# Patient Record
Sex: Male | Born: 1995 | Race: Black or African American | Hispanic: No | Marital: Single | Smoking: Current every day smoker
Health system: Southern US, Community
[De-identification: ages and names within clinical notes are randomized; demographics above are authoritative.]

---
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
## 2014-02-14 ENCOUNTER — Encounter (HOSPITAL_BASED_OUTPATIENT_CLINIC_OR_DEPARTMENT_OTHER): Payer: Self-pay | Admitting: Emergency Medicine

## 2014-02-14 ENCOUNTER — Emergency Department (HOSPITAL_BASED_OUTPATIENT_CLINIC_OR_DEPARTMENT_OTHER)
Admission: EM | Admit: 2014-02-14 | Discharge: 2014-02-14 | Disposition: A | Discharge status: Home / Self Care | Payer: Worker's Compensation | Attending: Emergency Medicine | Admitting: Emergency Medicine

## 2014-02-14 DIAGNOSIS — W298XXA Contact with other powered powered hand tools and household machinery, initial encounter: Secondary | ICD-10-CM | POA: Insufficient documentation

## 2014-02-14 DIAGNOSIS — Y9389 Activity, other specified: Secondary | ICD-10-CM | POA: Insufficient documentation

## 2014-02-14 DIAGNOSIS — S81009A Unspecified open wound, unspecified knee, initial encounter: Secondary | ICD-10-CM | POA: Insufficient documentation

## 2014-02-14 DIAGNOSIS — S81812A Laceration without foreign body, left lower leg, initial encounter: Secondary | ICD-10-CM

## 2014-02-14 DIAGNOSIS — F17200 Nicotine dependence, unspecified, uncomplicated: Secondary | ICD-10-CM | POA: Insufficient documentation

## 2014-02-14 DIAGNOSIS — Y9289 Other specified places as the place of occurrence of the external cause: Secondary | ICD-10-CM | POA: Diagnosis not present

## 2014-02-14 DIAGNOSIS — S81809A Unspecified open wound, unspecified lower leg, initial encounter: Secondary | ICD-10-CM | POA: Diagnosis not present

## 2014-02-14 DIAGNOSIS — S91009A Unspecified open wound, unspecified ankle, initial encounter: Principal | ICD-10-CM

## 2014-02-14 NOTE — ED Provider Notes (Signed)
LACERATION REPAIR Performed by: Elpidio AnisUPSTILL, Mattie Nordell A Authorized by: Elpidio AnisUPSTILL, Merranda Bolls A Consent: Verbal consent obtained. Risks and benefits: risks, benefits and alternatives were discussed Consent given by: patient Patient identity confirmed: provided demographic data Prepped and Draped in normal sterile fashion Wound explored  Laceration Location: left lower leg  Laceration Length: 4cm  No Foreign Bodies seen or palpated  Anesthesia: local infiltration  Local anesthetic: lidocaine 2% w/ epinephrine  Anesthetic total: 3 ml  Irrigation method: syringe Amount of cleaning: standard  Skin closure: staples  Number of sutures: 4  Technique: staples  Patient tolerance: Patient tolerated the procedure well with no immediate complications.   Arnoldo HookerShari A Liat Mayol, PA-C 02/14/14 [phone]

## 2014-02-14 NOTE — ED Notes (Signed)
Pt had 5 staples. Bacitracin and gauze drsg applied to wound.

## 2014-02-14 NOTE — Discharge Instructions (Signed)
Have staples removed in 7 to 10 days. Return for signs of infection.  If you were given medicines take as directed.  If you are on coumadin or contraceptives realize their levels and effectiveness is altered by many different medicines.  If you have any reaction (rash, tongues swelling, other) to the medicines stop taking and see a physician.   Please follow up as directed and return to the ER or see a physician for new or worsening symptoms.  Thank you. Filed Vitals:   02/14/14 1432  BP: 123/73  Pulse: 61  Temp: 97.8 F (36.6 C)  TempSrc: Oral  Resp: 18  Height: 5\' 9"  (1.753 m)  Weight: 152 lb (68.947 kg)  SpO2: 100%

## 2014-02-14 NOTE — ED Provider Notes (Signed)
CSN: [id number]     Arrival date & time 02/14/14  1426 History   First MD Initiated Contact with Patient 02/14/14 1434     Chief Complaint  Patient presents with  . Extremity Laceration     (Consider location/radiation/quality/duration/timing/severity/associated sxs/prior Treatment) HPI Comments: 18 year old male with no significant medical history presents with left leg laceration prior to arrival while playing with box cutters last night. No significant bleeding, mild gaping, mild tenderness to palpation  The history is provided by the patient.    History reviewed. No pertinent past medical history. History reviewed. No pertinent past surgical history. No family history on file. History  Substance Use Topics  . Smoking status: Current Every Day Smoker -- 1.00 packs/day    Types: Cigarettes  . Smokeless tobacco: Not on file  . Alcohol Use: Not on file    Review of Systems  Constitutional: Negative for fever.  Skin: Positive for wound.  Neurological: Negative for numbness.      Allergies  Review of patient's allergies indicates no known allergies.  Home Medications   Prior to Admission medications   Not on File   BP 123/73  Pulse 61  Temp(Src) 97.8 F (36.6 C) (Oral)  Resp 18  Ht 5\' 9"  (1.753 m)  Wt 152 lb (68.947 kg)  BMI 22.44 kg/m2  SpO2 100% Physical Exam  Nursing note and vitals reviewed. Constitutional: He appears well-developed and well-nourished. No distress.  Cardiovascular: Normal rate.   Musculoskeletal: He exhibits tenderness. He exhibits no edema.  Neurological: He is alert.  Skin: Skin is warm.  4 cm linear laceration with mild gaping to inner aspect of left calf, no active bleeding    ED Course  Procedures (including critical care time) See separate lac repair note by PA/ NP.  Labs Review Labs Reviewed - No data to display  Imaging Review No results found.   EKG Interpretation None      MDM   Final diagnoses:  Leg  laceration, left, initial encounter   Low risk superficial skin laceration will be repaired in the emergency department. Followup outpatient discussed the patient.  Results and differential diagnosis were discussed with the patient/parent/guardian. Close follow up outpatient was discussed, comfortable with the plan.   Medications - No data to display  Filed Vitals:   02/14/14 1432  BP: 123/73  Pulse: 61  Temp: 97.8 F (36.6 C)  TempSrc: Oral  Resp: 18  Height: 5\' 9"  (1.753 m)  Weight: 152 lb (68.947 kg)  SpO2: 100%         Enid SkeensJoshua M Summerlyn Fickel, MD 02/14/14 1521

## 2014-02-14 NOTE — ED Notes (Signed)
Pt states he was cutting open boxes with a box cutter and cut his left leg through his blue jeans.  2 in laceration to inner side of upper calf. Bleeding controlled.

## 2014-02-15 NOTE — ED Provider Notes (Signed)
I agree with lac repair and reviewed results post repair. Enid SkeensZAVITZ, Daiana Vitiello M   Enid SkeensJoshua M Kinzey Sheriff, MD 02/15/14 [phone]

## 2014-02-23 ENCOUNTER — Emergency Department (HOSPITAL_BASED_OUTPATIENT_CLINIC_OR_DEPARTMENT_OTHER)
Admission: EM | Admit: 2014-02-23 | Discharge: 2014-02-23 | Disposition: A | Discharge status: Home / Self Care | Payer: Worker's Compensation | Attending: Emergency Medicine | Admitting: Emergency Medicine

## 2014-02-23 ENCOUNTER — Encounter (HOSPITAL_BASED_OUTPATIENT_CLINIC_OR_DEPARTMENT_OTHER): Payer: Self-pay | Admitting: Emergency Medicine

## 2014-02-23 DIAGNOSIS — F17200 Nicotine dependence, unspecified, uncomplicated: Secondary | ICD-10-CM | POA: Diagnosis not present

## 2014-02-23 DIAGNOSIS — Z23 Encounter for immunization: Secondary | ICD-10-CM | POA: Insufficient documentation

## 2014-02-23 DIAGNOSIS — Z4802 Encounter for removal of sutures: Secondary | ICD-10-CM | POA: Insufficient documentation

## 2014-02-23 MED ORDER — TETANUS-DIPHTH-ACELL PERTUSSIS 5-2.5-18.5 LF-MCG/0.5 IM SUSP
0.5000 mL | Freq: Once | INTRAMUSCULAR | Status: AC
  Administered 2014-02-23: 0.5 mL via INTRAMUSCULAR
  Filled 2014-02-23: qty 0.5

## 2014-02-23 NOTE — ED Notes (Signed)
No signs of infection noted. Wound well healed. Stapes were in for 9 days. Possibly need Tetanus immunization updated.

## 2014-02-23 NOTE — ED Notes (Signed)
Pt presents to ED with needing staples removed from left lower leg

## 2014-02-23 NOTE — ED Provider Notes (Signed)
Medical screening examination/treatment/procedure(s) were performed by non-physician practitioner and as supervising physician I was immediately available for consultation/collaboration.   EKG Interpretation None        Sakari Raisanen, MD 02/23/14 1625 

## 2014-02-23 NOTE — ED Provider Notes (Signed)
CSN: [id number]     Arrival date & time 02/23/14  1513 History   First MD Initiated Contact with Patient 02/23/14 1530     Chief Complaint  Patient presents with  . Suture / Staple Removal     (Consider location/radiation/quality/duration/timing/severity/associated sxs/prior Treatment) Patient is a 18 y.o. male presenting with suture removal. The history is provided by the patient. No language interpreter was used.  Suture / Staple Removal Pertinent negatives include no fever. Associated symptoms comments: Staples placed in left lower leg 9 days ago. He has no complaints and reports no pain. He states at the time of the injury he reported his tetanus was up-to-date. However, his mother is here with him who reports his tetanus was out of date at the time of the injury and requests one be given. Marland Kitchen.    History reviewed. No pertinent past medical history. History reviewed. No pertinent past surgical history. History reviewed. No pertinent family history. History  Substance Use Topics  . Smoking status: Current Every Day Smoker -- 1.00 packs/day    Types: Cigarettes  . Smokeless tobacco: Not on file  . Alcohol Use: Not on file    Review of Systems  Constitutional: Negative for fever.  Skin:       See HPI.      Allergies  Review of patient's allergies indicates no known allergies.  Home Medications   Prior to Admission medications   Not on File   BP 134/82  Pulse 52  Temp(Src) 98.3 F (36.8 C) (Oral)  Resp 18  Ht 5\' 9"  (1.753 m)  Wt 140 lb (63.504 kg)  BMI 20.67 kg/m2  SpO2 100% Physical Exam  Constitutional: He appears well-developed and well-nourished. No distress.  Skin:  Well healed laceration to left proximal lower leg with intact staples.     ED Course  Procedures (including critical care time) Labs Review Labs Reviewed - No data to display  Imaging Review No results found.   EKG Interpretation None      MDM   Final diagnoses:  None    1.  Staple removal  Tetanus updated. Staples removed from well healed laceration.    Arnoldo HookerShari A Jadynn Epping, PA-C 02/23/14 1553

## 2014-02-23 NOTE — Discharge Instructions (Signed)
Suture Removal, Care After Refer to this sheet in the next few weeks. These instructions provide you with information on caring for yourself after your procedure. Your health care provider may also give you more specific instructions. Your treatment has been planned according to current medical practices, but problems sometimes occur. Call your health care provider if you have any problems or questions after your procedure. WHAT TO EXPECT AFTER THE PROCEDURE After your stitches (sutures) are removed, it is typical to have the following:  Some discomfort and swelling in the wound area.  Slight redness in the area. HOME CARE INSTRUCTIONS   If you have skin adhesive strips over the wound area, do not take the strips off. They will fall off on their own in a few days. If the strips remain in place after 14 days, you may remove them.  Change any bandages (dressings) at least once a day or as directed by your health care provider. If the bandage sticks, soak it off with warm, soapy water.  Apply cream or ointment only as directed by your health care provider. If using cream or ointment, wash the area with soap and water 2 times a day to remove all the cream or ointment. Rinse off the soap and pat the area dry with a clean towel.  Keep the wound area dry and clean. If the bandage becomes wet or dirty, or if it develops a bad smell, change it as soon as possible.  Continue to protect the wound from injury.  Use sunscreen when out in the sun. New scars become sunburned easily. SEEK MEDICAL CARE IF:  You have increasing redness, swelling, or pain in the wound.  You see pus coming from the wound.  You have a fever.  You notice a bad smell coming from the wound or dressing.  Your wound breaks open (edges not staying together). Document Released: 04/12/2001 Document Revised: 05/08/2013 Document Reviewed: 02/27/2013 [facility] Patient Information 2015 [place], [place]. This information is not  intended to replace advice given to you by your health care provider. Make sure you discuss any questions you have with your health care provider. Tetanus Toxoid Adsorbed injection What is this medicine? TETANUS TOXOID (TET n uhs tok soid) is a vaccine. It is used to prevent infections of tetanus (lockjaw). This medicine may be used for other purposes; ask your health care provider or pharmacist if you have questions. What should I tell my health care provider before I take this medicine? They need to know if you have any of these conditions: -bleeding disorder -immune system problems -infection with fever -low levels of platelets in the blood -an unusual or allergic reaction to tetanus toxoid, vaccines, latex, thimerosal, aluminium, other medicines, foods, dyes, or preservatives -pregnant or trying to get pregnant -breast-feeding How should I use this medicine? This vaccine is for injection into a muscle. It is given by a health care professional. A copy of Vaccine Information Statements will be given before each vaccination. Read this sheet carefully each time. The sheet may change frequently. Talk to your pediatrician regarding the use of this medicine in children. While this drug may be prescribed for children as young as 47 years of age for selected conditions, precautions do apply. Overdosage: If you think you have taken too much of this medicine contact a poison control center or emergency room at once. NOTE: This medicine is only for you. Do not share this medicine with others. What if I miss a dose? Keep appointments for follow-up (  booster) doses as directed. It is important not to miss your dose. Call your doctor or health care professional if you are unable to keep an appointment. What may interact with this medicine? -adalimumab -anakinra -infliximab -medicines that suppress your immune system -medicines to treat cancer -steroid medicines like prednisone or cortisone This list  may not describe all possible interactions. Give your health care provider a list of all the medicines, herbs, non-prescription drugs, or dietary supplements you use. Also tell them if you smoke, drink alcohol, or use illegal drugs. Some items may interact with your medicine. What should I watch for while using this medicine? Report any adverse reaction following administration to your health care provider. Contact your doctor or health care professional and seek emergency medical care if any serious side effects occur. This vaccine, like all vaccines, may not fully protect everyone. What side effects may I notice from receiving this medicine? Side effects that you should report to your doctor or health care professional as soon as possible: -allergic reactions like skin rash, itching or hives, swelling of the face, lips, or tongue -arthritis pain -breathing problems -extreme changes in behavior -fast, irregular heartbeat -fever over 100 degrees F -pain, tingling, numbness in the hands or feet -seizures -unusually weak or tired Side effects that usually do not require medical attention (report to your doctor or health care professional if they continue or are bothersome): -aches or pains -bruising, pain, swelling at site where injected -low-grade fever of 100 degrees F or less -nausea This list may not describe all possible side effects. Call your doctor for medical advice about side effects. You may report side effects to FDA at 1-800-FDA-1088. Where should I keep my medicine? This drug is given in a hospital or clinic and will not be stored at home. NOTE: This sheet is a summary. It may not cover all possible information. If you have questions about this medicine, talk to your doctor, pharmacist, or health care provider.  2015, [employer]vier/Gold Standard. (2008-04-03 11:28:32)

## 2016-09-28 ENCOUNTER — Emergency Department (HOSPITAL_BASED_OUTPATIENT_CLINIC_OR_DEPARTMENT_OTHER)
Admission: EM | Admit: 2016-09-28 | Discharge: 2016-09-28 | Disposition: A | Discharge status: Home / Self Care | Payer: Self-pay | Attending: Physician Assistant | Admitting: Physician Assistant

## 2016-09-28 ENCOUNTER — Encounter (HOSPITAL_BASED_OUTPATIENT_CLINIC_OR_DEPARTMENT_OTHER): Payer: Self-pay | Admitting: *Deleted

## 2016-09-28 ENCOUNTER — Emergency Department (HOSPITAL_BASED_OUTPATIENT_CLINIC_OR_DEPARTMENT_OTHER): Payer: Self-pay

## 2016-09-28 DIAGNOSIS — K219 Gastro-esophageal reflux disease without esophagitis: Secondary | ICD-10-CM | POA: Insufficient documentation

## 2016-09-28 DIAGNOSIS — F17210 Nicotine dependence, cigarettes, uncomplicated: Secondary | ICD-10-CM | POA: Insufficient documentation

## 2016-09-28 DIAGNOSIS — F1290 Cannabis use, unspecified, uncomplicated: Secondary | ICD-10-CM | POA: Insufficient documentation

## 2016-09-28 LAB — CBC WITH DIFFERENTIAL/PLATELET
Basophils Absolute: 0 10*3/uL (ref 0.0–0.1)
Basophils Relative: 0 %
Eosinophils Absolute: 0.4 10*3/uL (ref 0.0–0.7)
Eosinophils Relative: 8 %
HCT: 36.9 % — ABNORMAL LOW (ref 39.0–52.0)
HEMOGLOBIN: 12.9 g/dL — AB (ref 13.0–17.0)
Lymphocytes Relative: 36 %
Lymphs Abs: 1.9 10*3/uL (ref 0.7–4.0)
MCH: 28.8 pg (ref 26.0–34.0)
MCHC: 35 g/dL (ref 30.0–36.0)
MCV: 82.4 fL (ref 78.0–100.0)
Monocytes Absolute: 0.4 10*3/uL (ref 0.1–1.0)
Monocytes Relative: 8 %
NEUTROS ABS: 2.6 10*3/uL (ref 1.7–7.7)
Neutrophils Relative %: 48 %
Platelets: 174 10*3/uL (ref 150–400)
RBC: 4.48 MIL/uL (ref 4.22–5.81)
RDW: 13.6 % (ref 11.5–15.5)
WBC: 5.3 10*3/uL (ref 4.0–10.5)

## 2016-09-28 LAB — COMPREHENSIVE METABOLIC PANEL
ALBUMIN: 4.1 g/dL (ref 3.5–5.0)
ALT: 10 U/L — ABNORMAL LOW (ref 17–63)
AST: 20 U/L (ref 15–41)
Alkaline Phosphatase: 54 U/L (ref 38–126)
Anion gap: 7 (ref 5–15)
BUN: 14 mg/dL (ref 6–20)
CHLORIDE: 107 mmol/L (ref 101–111)
CO2: 28 mmol/L (ref 22–32)
Calcium: 8.8 mg/dL — ABNORMAL LOW (ref 8.9–10.3)
Creatinine, Ser: 1.15 mg/dL (ref 0.61–1.24)
GFR calc Af Amer: >60 mL/min (ref >60)
GFR calc non Af Amer: >60 mL/min (ref >60)
Glucose, Bld: 90 mg/dL (ref 65–99)
Potassium: 3.9 mmol/L (ref 3.5–5.1)
SODIUM: 142 mmol/L (ref 135–145)
Total Bilirubin: 0.8 mg/dL (ref 0.3–1.2)
Total Protein: 6.9 g/dL (ref 6.5–8.1)

## 2016-09-28 LAB — LIPASE, BLOOD: Lipase: 25 U/L (ref 11–51)

## 2016-09-28 LAB — TROPONIN I: Troponin I: <0.03 ng/mL (ref <0.03)

## 2016-09-28 MED ORDER — GI COCKTAIL ~~LOC~~
30.0000 mL | Freq: Once | ORAL | Status: AC
  Administered 2016-09-28: 30 mL via ORAL
  Filled 2016-09-28: qty 30

## 2016-09-28 MED ORDER — FAMOTIDINE 20 MG PO TABS: 20.0000 mg | ORAL_TABLET | Freq: Two times a day (BID) | ORAL | 0 refills | Status: DC

## 2016-09-28 NOTE — ED Provider Notes (Signed)
[facility] Note   CSN: [id number] Arrival date & time: 09/28/16  1756  By signing my name below, I, Mark Wolfe, attest that this documentation has been prepared under the direction and in the presence of Mark Wolfe, [place]. Electronically Signed: Talbert NanPaul Wolfe, Scribe. 09/28/16. 6:30 PM.   History   Chief Complaint Chief Complaint  Patient presents with  . Chest Pain    HPI Mark Wolfe is a 21 y.o. male who presents to the Emergency Department complaining of acute onset, burning, constant pain that radiates from his abdomen to his chest that began at 4pm today. Patient reports the pain started approximately 30 minutes after eating fried chicken while he was on his lunch break. He reports the pain began when he started running around and moving at work. Pain described as it feels like getting hit in the stomach. Pain is exacerbated by sitting up and moving forwards. Pt denies having pain like this before. Pt has not taken anything for the pain. Pt has no associated symptoms. Pt is a smoker, 2-3 cigarettes a day. Pt has no h/o diabetes, heart disease. Pt denies fever, headache, dizziness, cough, short of breath, wheezing, heart palpations, nausea, vomiting, diarrhea, urinary symptoms. Denies personal or family history of heart disease.  The history is provided by the patient. No language interpreter was used.    History reviewed. No pertinent past medical history.  There are no active problems to display for this patient.   History reviewed. No pertinent surgical history.     Home Medications    Prior to Admission medications   Medication Sig Start Date End Date Taking? Authorizing Provider  famotidine (PEPCID) 20 MG tablet Take 1 tablet (20 mg total) by mouth 2 (two) times daily. 09/28/16   Barrett HenleNicole Elizabeth Anneth Brunell, PA-C    Family History History reviewed. No pertinent family history.  Social History Social History  Substance Use Topics  . Smoking  status: Current Every Day Smoker    Packs/day: 1.00    Types: Cigarettes  . Smokeless tobacco: Never Used  . Alcohol use No     Allergies   Patient has no known allergies.   Review of Systems Review of Systems  Cardiovascular: Positive for chest pain.  Gastrointestinal: Positive for abdominal pain.  All other systems reviewed and are negative.    Physical Exam Updated Vital Signs BP 146/87   Pulse (!) 48   Temp 98.7 F (37.1 C) (Oral)   Resp 18   Ht 5\' 9"  (1.753 m)   Wt 66.7 kg   SpO2 100%   BMI 21.71 kg/m   Physical Exam  Constitutional: He is oriented to person, place, and time. He appears well-developed and well-nourished. No distress.  HENT:  Head: Normocephalic and atraumatic.  Mouth/Throat: Uvula is midline, oropharynx is clear and moist and mucous membranes are normal. No oropharyngeal exudate, posterior oropharyngeal edema, posterior oropharyngeal erythema or tonsillar abscesses. No tonsillar exudate.  Eyes: Conjunctivae and EOM are normal. Pupils are equal, round, and reactive to light. Right eye exhibits no discharge. Left eye exhibits no discharge. No scleral icterus.  Neck: Normal range of motion. Neck supple.  Cardiovascular: Normal rate, regular rhythm, normal heart sounds and intact distal pulses.   Pulmonary/Chest: Effort normal and breath sounds normal. No respiratory distress. He has no wheezes. He has no rales. He exhibits no tenderness.  Abdominal: Soft. Bowel sounds are normal. He exhibits no distension and no mass. There is tenderness. There is no rebound  and no guarding.  Tenderness over epigastric region.  Musculoskeletal: Normal range of motion. He exhibits no edema.  Neurological: He is alert and oriented to person, place, and time.  Skin: Skin is warm and dry. He is not diaphoretic.  Nursing note and vitals reviewed.    ED Treatments / Results   DIAGNOSTIC STUDIES: Oxygen Saturation is 100% on room air, normal by my interpretation.     COORDINATION OF CARE: 6:26 PM Discussed treatment plan with pt at bedside and pt agreed to plan, which includes medication for GERD.   Labs (all labs ordered are listed, but only abnormal results are displayed) Labs Reviewed  CBC WITH DIFFERENTIAL/PLATELET - Abnormal; Notable for the following:       Result Value   Hemoglobin 12.9 (*)    HCT 36.9 (*)    All other components within normal limits  COMPREHENSIVE METABOLIC PANEL - Abnormal; Notable for the following:    Calcium 8.8 (*)    ALT 10 (*)    All other components within normal limits  TROPONIN I  LIPASE, BLOOD    EKG  EKG Interpretation  Date/Time:  Wednesday September 28 2016 18:08:13 EST Ventricular Rate:  53 PR Interval:  136 QRS Duration: 90 QT Interval:  406 QTC Calculation: 380 R Axis:   84 Text Interpretation:  Sinus bradycardia with sinus arrhythmia Otherwise normal ECG BER likely  Confirmed by Kandis Mannan ([id number]) on 09/28/2016 6:31:42 PM       Radiology Dg Chest 2 View  Result Date: 09/28/2016 CLINICAL DATA:  21 y/o  M; chest and abdominal pain. EXAM: CHEST  2 VIEW COMPARISON:  None. FINDINGS: The heart size and mediastinal contours are within normal limits. Both lungs are clear. The visualized skeletal structures are unremarkable. IMPRESSION: No active cardiopulmonary disease. Electronically Signed   By: Mitzi Hansen M.D.   On: 09/28/2016 19:14    Procedures Procedures (including critical care time)  Medications Ordered in ED Medications  gi cocktail (Maalox,Lidocaine,Donnatal) (30 mLs Oral Given 09/28/16 1852)     Initial Impression / Assessment and Plan / ED Course  I have reviewed the triage vital signs and the nursing notes.  Pertinent labs & imaging results that were available during my care of the patient were reviewed by me and considered in my medical decision making (see chart for details).     Patient presents with pain radiating from his epigastric region to chest  that started approximately 30 minutes after eating fried chicken nuggets on his work break. Pain is worse with movement. Denies history of similar symptoms. Denies personal or family history of heart disease. VSS. Exam revealed mild tenderness over epigastric region, no peritoneal signs. Remaining exam unremarkable. No chest wall tenderness. Lungs clear to auscultation bilaterally. Patient given GI cocktail on the ED. EKG showed sinus rhythm with no acute ischemic changes. Troponin negative. Labs unremarkable. Chest x-ray negative. HEART score 1. Patient symptoms appear to be consistent with GERD. I have a low suspicion for ACS, PE, dissection, or other acute cardiac event at this time. On reevaluation patient reports mild improvement of symptoms however patient states he does not want any other medications and just wants to go home and rest. Discussed results and plan for discharge with patient. Plan to discharge patient home with Pepcid and discussed symptomatic treatment including lifestyle and diet changes. Advised patient to follow up with PCP for follow-up evaluation. Discussed return precautions.    Final Clinical Impressions(s) / ED Diagnoses   Final diagnoses:  Gastroesophageal reflux disease, esophagitis presence not specified    New Prescriptions New Prescriptions   FAMOTIDINE (PEPCID) 20 MG TABLET    Take 1 tablet (20 mg total) by mouth 2 (two) times daily.   I personally performed the services described in this documentation, which was scribed in my presence. The recorded information has been reviewed and is accurate.     Satira Sark [place], [place] 09/28/16 2045    Courteney Randall An, MD 09/28/16 [phone]

## 2016-09-28 NOTE — ED Notes (Signed)
ED Provider at bedside. 

## 2016-09-28 NOTE — ED Notes (Addendum)
Pt reports worsening pain with movement.  Pt not in any acute distress, able to talk on cell phone without issue

## 2016-09-28 NOTE — ED Notes (Signed)
Pt reports sudden onset of chest pain today after eating crispy chicken nuggets from [employer]. Denies similar sx in the past

## 2016-09-28 NOTE — Discharge Instructions (Signed)
Take your medication as prescribed. I recommend following the diet listed below to help with your symptoms. Please follow up with a primary care provider from the Resource Guide provided below in 3-4 days as needed. Please return to the Emergency Department if symptoms worsen or new onset of fever, new/worsening chest pain, difficulty breathing, abdominal pain, vomiting.

## 2016-09-28 NOTE — ED Notes (Signed)
Pt verbalizes understanding of d/c instructions and denies any further needs at this time. 

## 2016-09-28 NOTE — ED Triage Notes (Signed)
Pt reports abdominal pain radiating into his chest intermittenly x 2 hours.  Denies SOB, N/V.  Pt ambulatory, no acute distress noted.  Pt took tums without difficulty.

## 2017-03-22 ENCOUNTER — Emergency Department (HOSPITAL_BASED_OUTPATIENT_CLINIC_OR_DEPARTMENT_OTHER)
Admission: EM | Admit: 2017-03-22 | Discharge: 2017-03-22 | Disposition: A | Discharge status: Home / Self Care | Payer: Self-pay | Attending: Emergency Medicine | Admitting: Emergency Medicine

## 2017-03-22 ENCOUNTER — Encounter (HOSPITAL_BASED_OUTPATIENT_CLINIC_OR_DEPARTMENT_OTHER): Payer: Self-pay

## 2017-03-22 DIAGNOSIS — T754XXA Electrocution, initial encounter: Secondary | ICD-10-CM | POA: Insufficient documentation

## 2017-03-22 DIAGNOSIS — W868XXA Exposure to other electric current, initial encounter: Secondary | ICD-10-CM | POA: Insufficient documentation

## 2017-03-22 DIAGNOSIS — Y990 Civilian activity done for income or pay: Secondary | ICD-10-CM | POA: Insufficient documentation

## 2017-03-22 DIAGNOSIS — Y939 Activity, unspecified: Secondary | ICD-10-CM | POA: Insufficient documentation

## 2017-03-22 DIAGNOSIS — F17210 Nicotine dependence, cigarettes, uncomplicated: Secondary | ICD-10-CM | POA: Insufficient documentation

## 2017-03-22 DIAGNOSIS — Y929 Unspecified place or not applicable: Secondary | ICD-10-CM | POA: Insufficient documentation

## 2017-03-22 NOTE — ED Provider Notes (Signed)
[facility] Note   CSN: [id number] Arrival date & time: 03/22/17  1328     History   Chief Complaint Chief Complaint  Patient presents with  . Electric Shock    HPI Mark Wolfe is a 21 y.o. male who presents to the emergency department with a chief complaint of electric shock. The patient reports that he was at work 4 days ago when the weed eater made contact with an electrical fence. He reports that he felt "a shock run inside of him" and immediately let go of the weed eater, but then proceeded to pick it back up and continue working. He reports that his employer became concerned when he called out of work yesterday, and sent him to the emergency department for evaluation. The patient states that he has not had any sicker vacation time in the last 5 years since he was at the job, and just wanted a day off. He denies fever, chills, weakness, numbness, headache, nausea, or vomiting. No treatment prior to arrival.   The history is provided by the patient. No language interpreter was used.    History reviewed. No pertinent past medical history.  There are no active problems to display for this patient.   History reviewed. No pertinent surgical history.     Home Medications    Prior to Admission medications   Not on File    Family History No family history on file.  Social History Social History  Substance Use Topics  . Smoking status: Current Every Day Smoker    Packs/day: 1.00    Types: Cigarettes  . Smokeless tobacco: Never Used  . Alcohol use No     Allergies   Patient has no known allergies.   Review of Systems Review of Systems  Constitutional: Negative for activity change, chills and fever.  Respiratory: Negative for shortness of breath.   Cardiovascular: Negative for chest pain and palpitations.  Gastrointestinal: Negative for abdominal pain, diarrhea, nausea and vomiting.  Musculoskeletal: Negative for back pain.  Skin:  Negative for rash and wound.  Neurological: Negative for dizziness, weakness, numbness and headaches.   Physical Exam Updated Vital Signs BP 122/75 (BP Location: Right Arm)   Pulse 60   Temp 98.6 F (37 C) (Oral)   Resp 16   Ht 5\' 9"  (1.753 m)   Wt 65.8 kg (145 lb)   SpO2 100%   BMI 21.41 kg/m   Physical Exam  Constitutional: He is oriented to person, place, and time. He appears well-developed and well-nourished. No distress.  HENT:  Head: Normocephalic.  Eyes: Conjunctivae are normal.  Neck: Neck supple.  Cardiovascular: Normal rate, regular rhythm, normal heart sounds and intact distal pulses.  Exam reveals no gallop and no friction rub.   No murmur heard. Pulmonary/Chest: Effort normal and breath sounds normal. No respiratory distress. He has no wheezes. He has no rales. He exhibits no tenderness.  Abdominal: Soft. Bowel sounds are normal. He exhibits no distension and no mass. There is no tenderness. There is no rebound and no guarding.  Musculoskeletal: Normal range of motion. He exhibits no edema, tenderness or deformity.  Neurological: He is alert and oriented to person, place, and time.  Cranial nerves 2-12 intact. Finger-to-nose is normal. 5/5 motor strength of the bilateral upper and lower extremities. Moves all four extremities. Negative Romberg. Ambulatory without difficulty. NVI.    Skin: Skin is warm and dry. He is not diaphoretic.  No burns noted throughout the exam.  Psychiatric: His behavior is normal.  Nursing note and vitals reviewed.  ED Treatments / Results  Labs (all labs ordered are listed, but only abnormal results are displayed) Labs Reviewed - No data to display  EKG  EKG Interpretation  Date/Time:  Wednesday March 22 2017 13:37:32 EDT Ventricular Rate:  61 PR Interval:  138 QRS Duration: 86 QT Interval:  376 QTC Calculation: 378 R Axis:   69 Text Interpretation:  Normal sinus rhythm Normal ECG No significant change since last tracing  Confirmed by Melene Plan [phone]) on 03/22/2017 2:00:45 PM Also confirmed by Melene Plan [phone]), editor Misty Stanley [phone])  on 03/22/2017 2:02:58 PM       Radiology No results found.  Procedures Procedures (including critical care time)  Medications Ordered in ED Medications - No data to display   Initial Impression / Assessment and Plan / ED Course  I have reviewed the triage vital signs and the nursing notes.  Pertinent labs & imaging results that were available during my care of the patient were reviewed by me and considered in my medical decision making (see chart for details).     21 year old male presenting to the emergency department with a chief complaint of electrical shock that occurred 4 days ago. He has no medical complaints at this time. EKG with normal sinus rhythm. He is requesting a note to return to work. Vital signs stable. No acute distress. The patient is safe for discharge at this time.  Final Clinical Impressions(s) / ED Diagnoses   Final diagnoses:  Electrical injury in adult    New Prescriptions New Prescriptions   No medications on file     Alvy Bimler 03/22/17 1515    Loren Racer, MD 03/24/17 [phone]

## 2017-03-22 NOTE — Discharge Instructions (Signed)
If he develop any new or worsening symptoms, please return to the emergency department for reevaluation.

## 2017-03-22 NOTE — ED Triage Notes (Signed)
Pt states at work Sunday he "got a little shock" when weed eater vs electric fence-pt states he did not work yesterday so his work sent him here today to get checked-states he did not miss work due to Sunday event-NAD-steady gait

## 2018-02-25 IMAGING — CR DG CHEST 2V
2 series · 2 of 2 positions shown · non-contrast
Comparison: None.

CLINICAL DATA: 21 y/o  M; chest and abdominal pain.

EXAM:
CHEST  2 VIEW

[w chest pa]
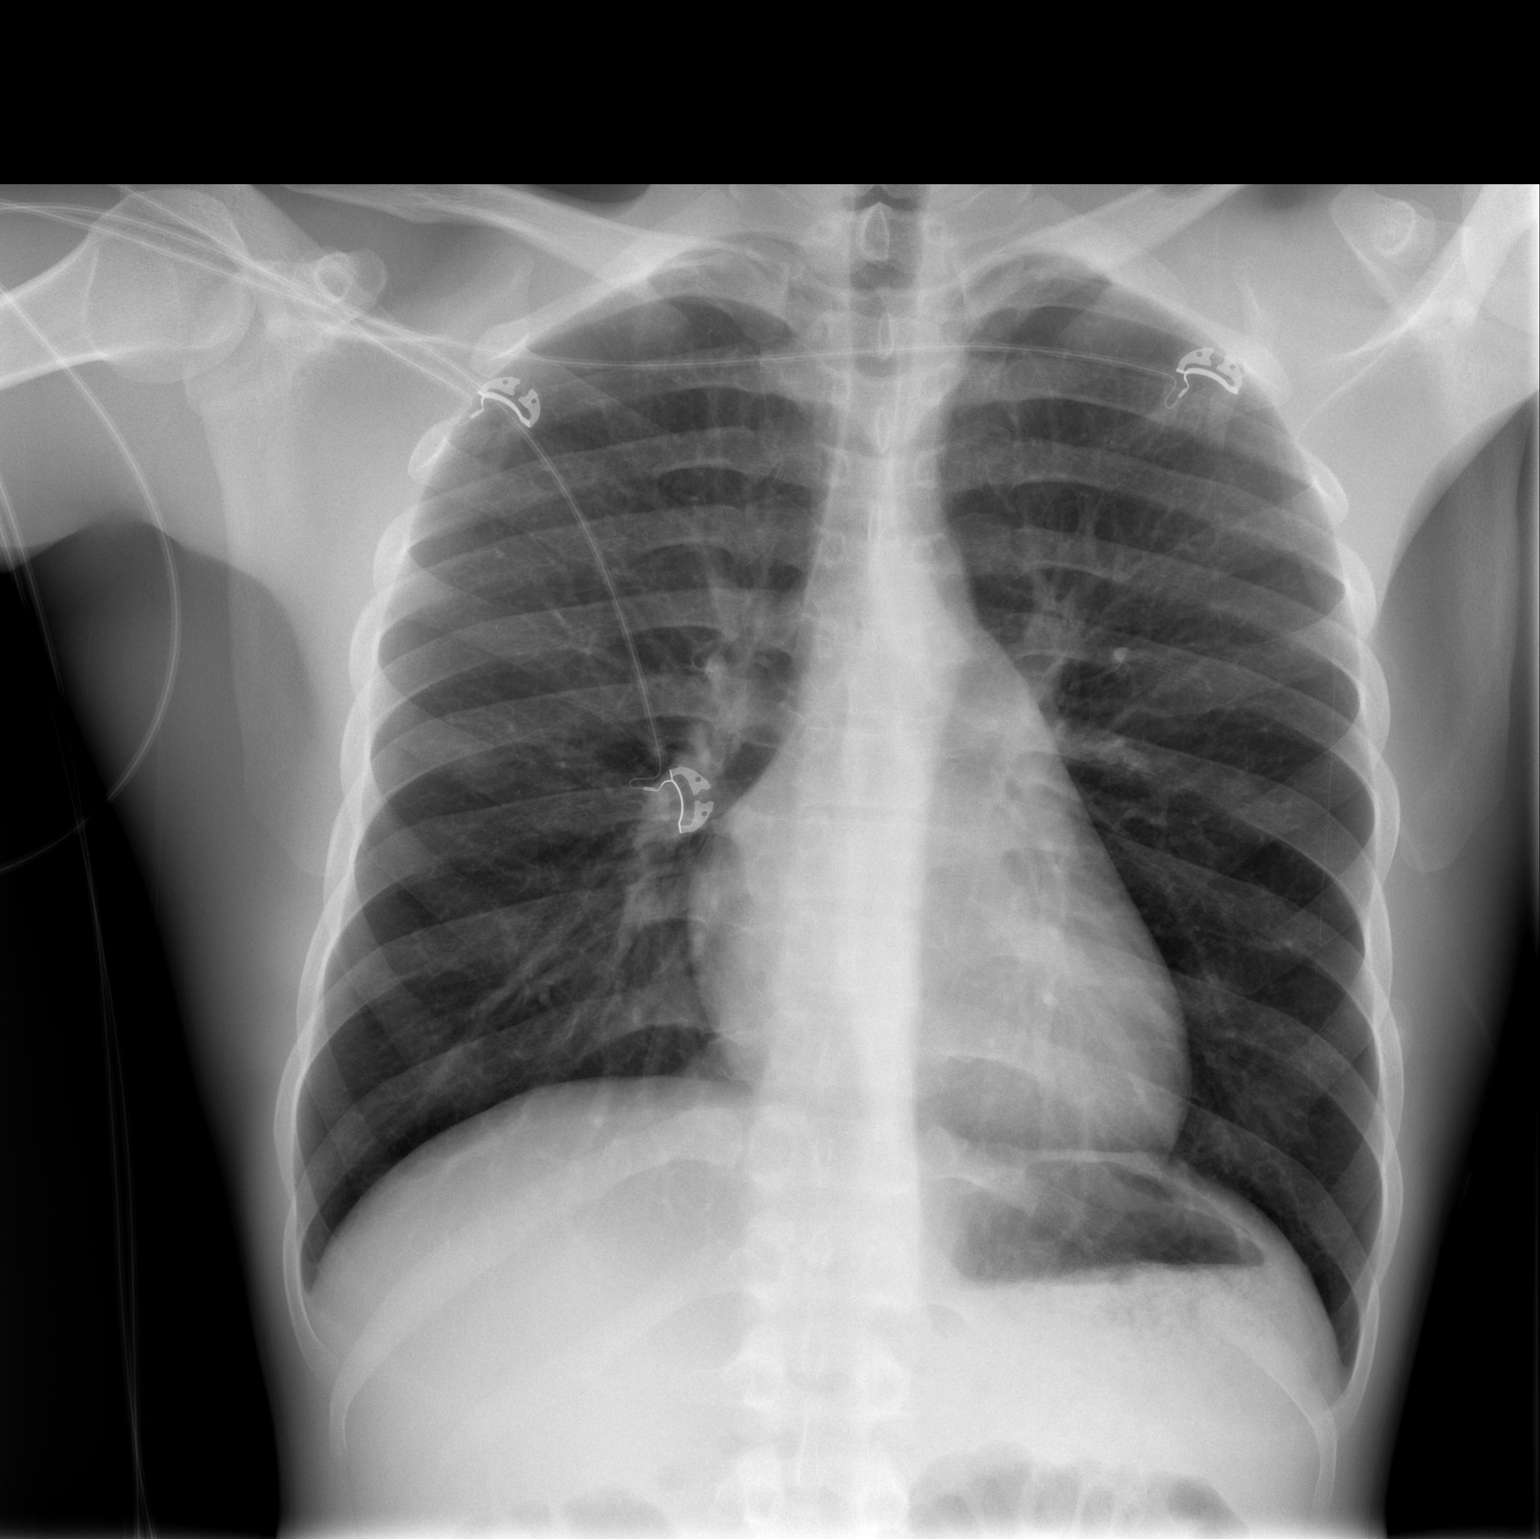

[w chest lat]
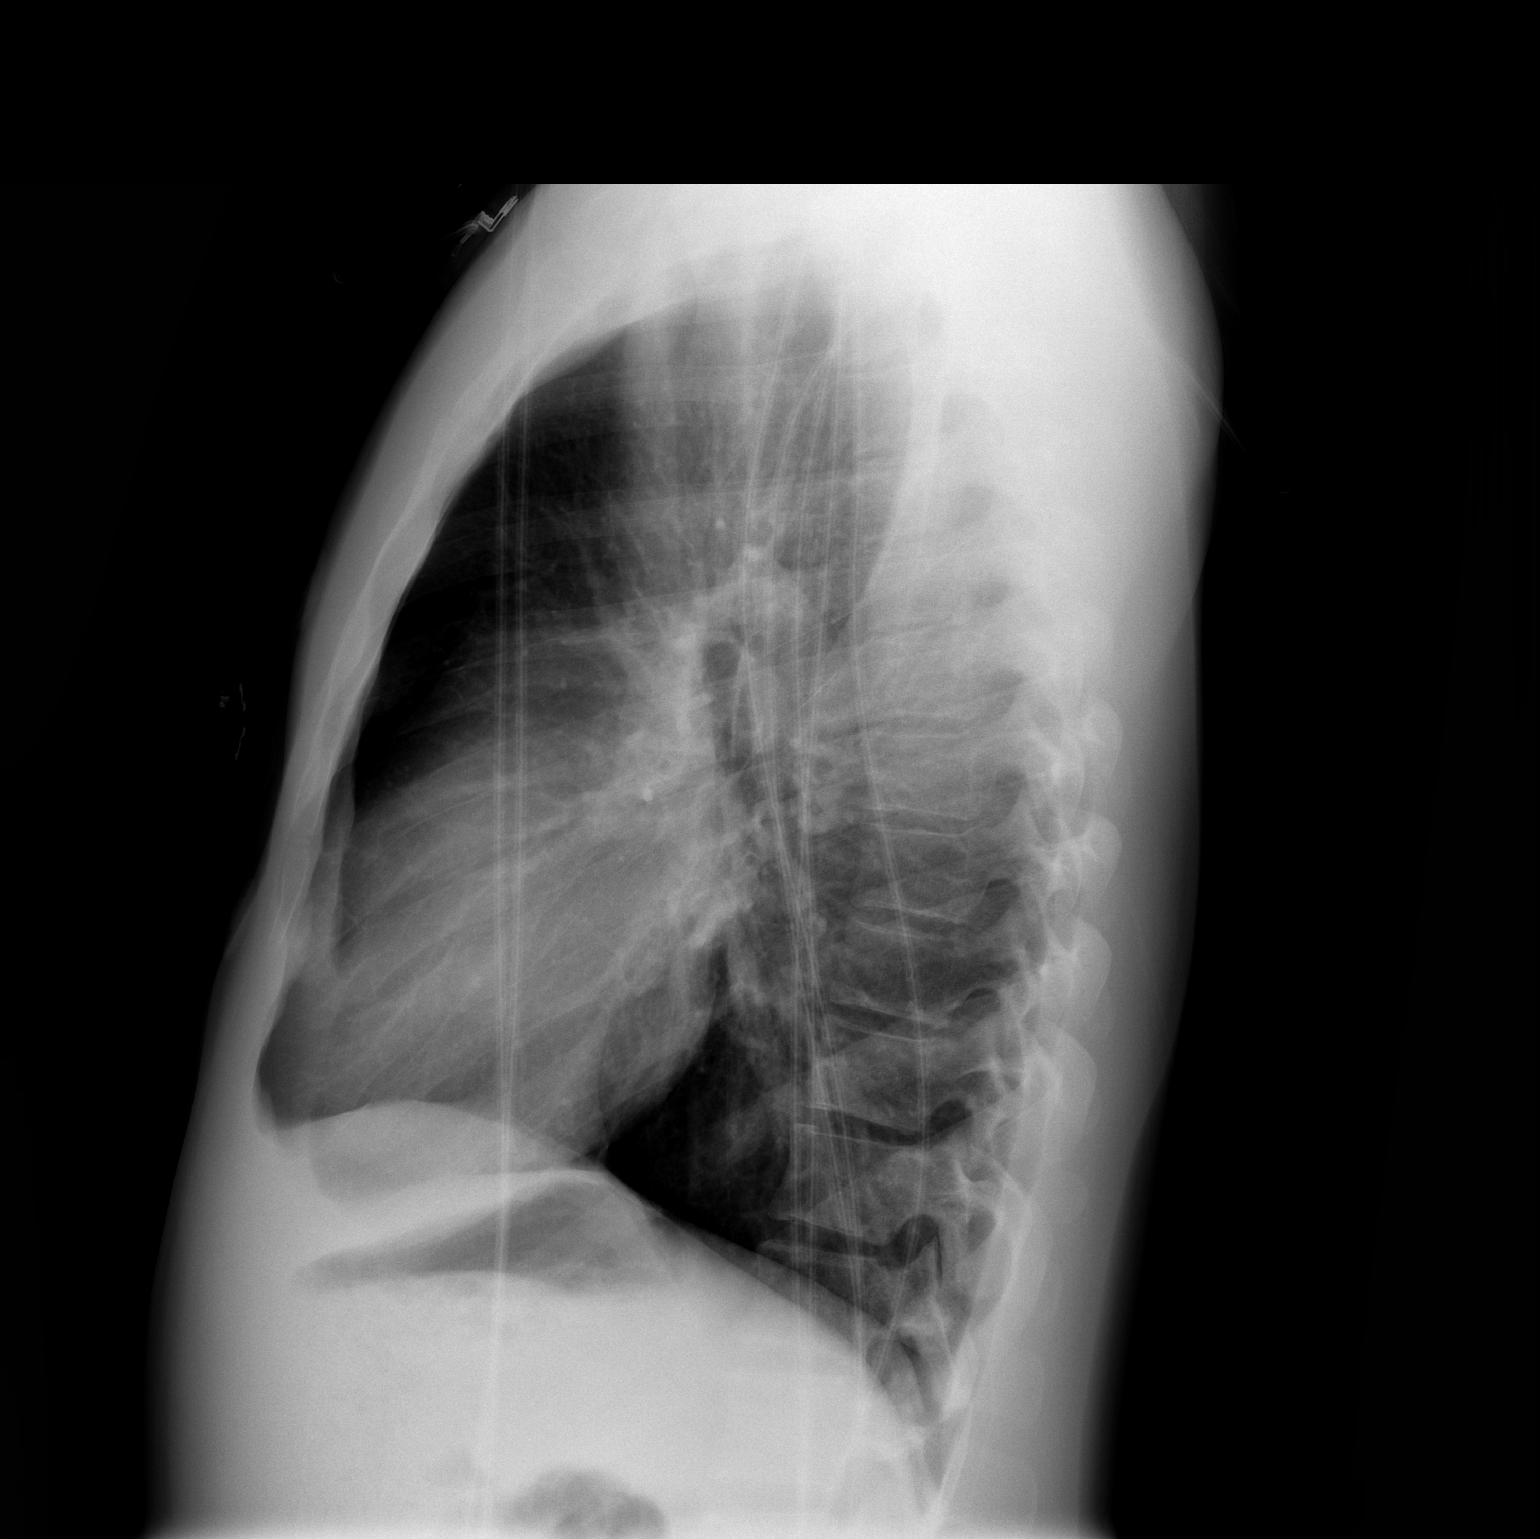

[2 of 2 positions shown; findings below may reference images not displayed]

FINDINGS: The heart size and mediastinal contours are within normal limits.
Both lungs are clear. The visualized skeletal structures are
unremarkable.
IMPRESSION: No active cardiopulmonary disease.

By: Bamyan Tiger M.D.
